# Patient Record
Sex: Male | Born: 1990 | Race: White | Hispanic: No | Marital: Single | Smoking: Never smoker
Health system: Southern US, Community
[De-identification: ages and names within clinical notes are randomized; demographics above are authoritative.]

## PROBLEM LIST (undated history)

## (undated) DIAGNOSIS — F419 Anxiety disorder, unspecified: Secondary | ICD-10-CM

## (undated) DIAGNOSIS — K219 Gastro-esophageal reflux disease without esophagitis: Secondary | ICD-10-CM

## (undated) HISTORY — PX: TONSILLECTOMY: SUR1361

## (undated) HISTORY — PX: WISDOM TOOTH EXTRACTION: SHX21

## (undated) HISTORY — DX: Gastro-esophageal reflux disease without esophagitis: K21.9

## (undated) HISTORY — DX: Anxiety disorder, unspecified: F41.9

---
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
## 2011-09-02 IMAGING — CR DG KNEE COMPLETE 4+V*R*
2 series · 2 of 2 positions shown · non-contrast
Comparison: None.

CLINICAL DATA: Bilateral knee pain intermittently for several
years.

RIGHT KNEE - COMPLETE 4+ VIEW

[view not recorded (1 of 2)]
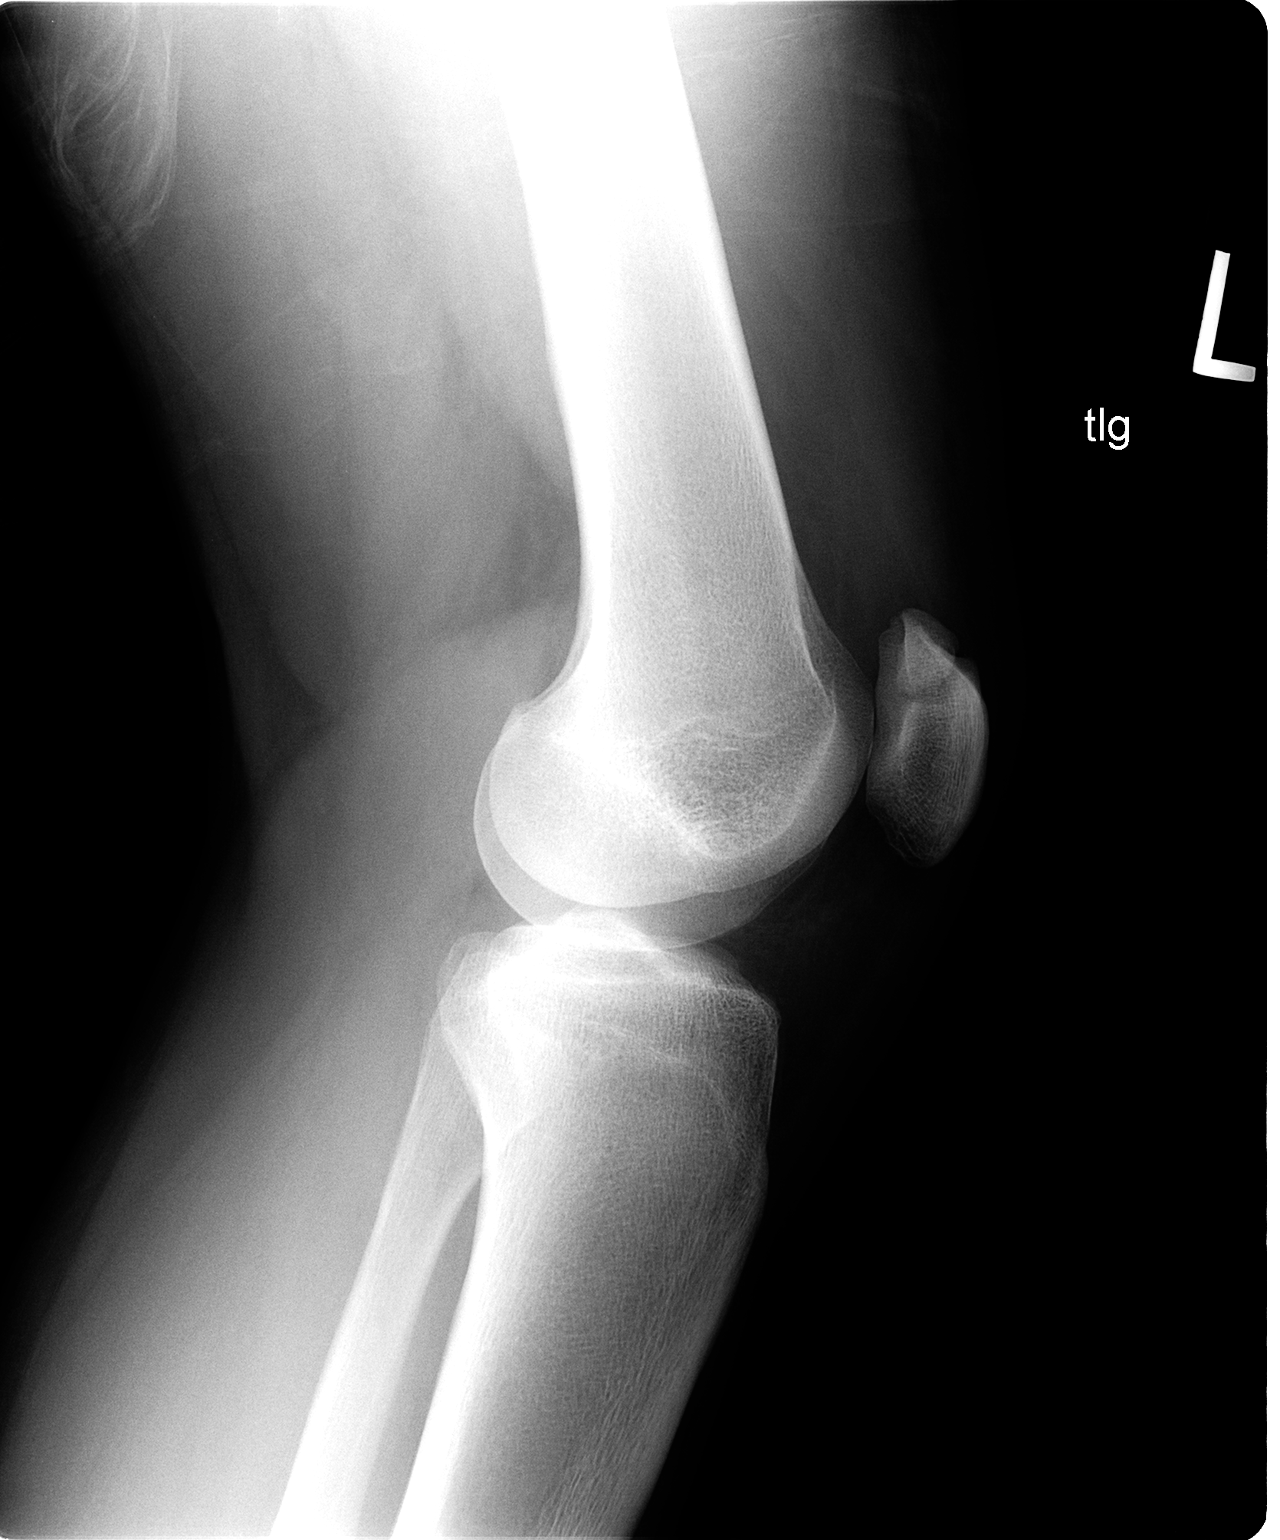

[view not recorded (2 of 2)]
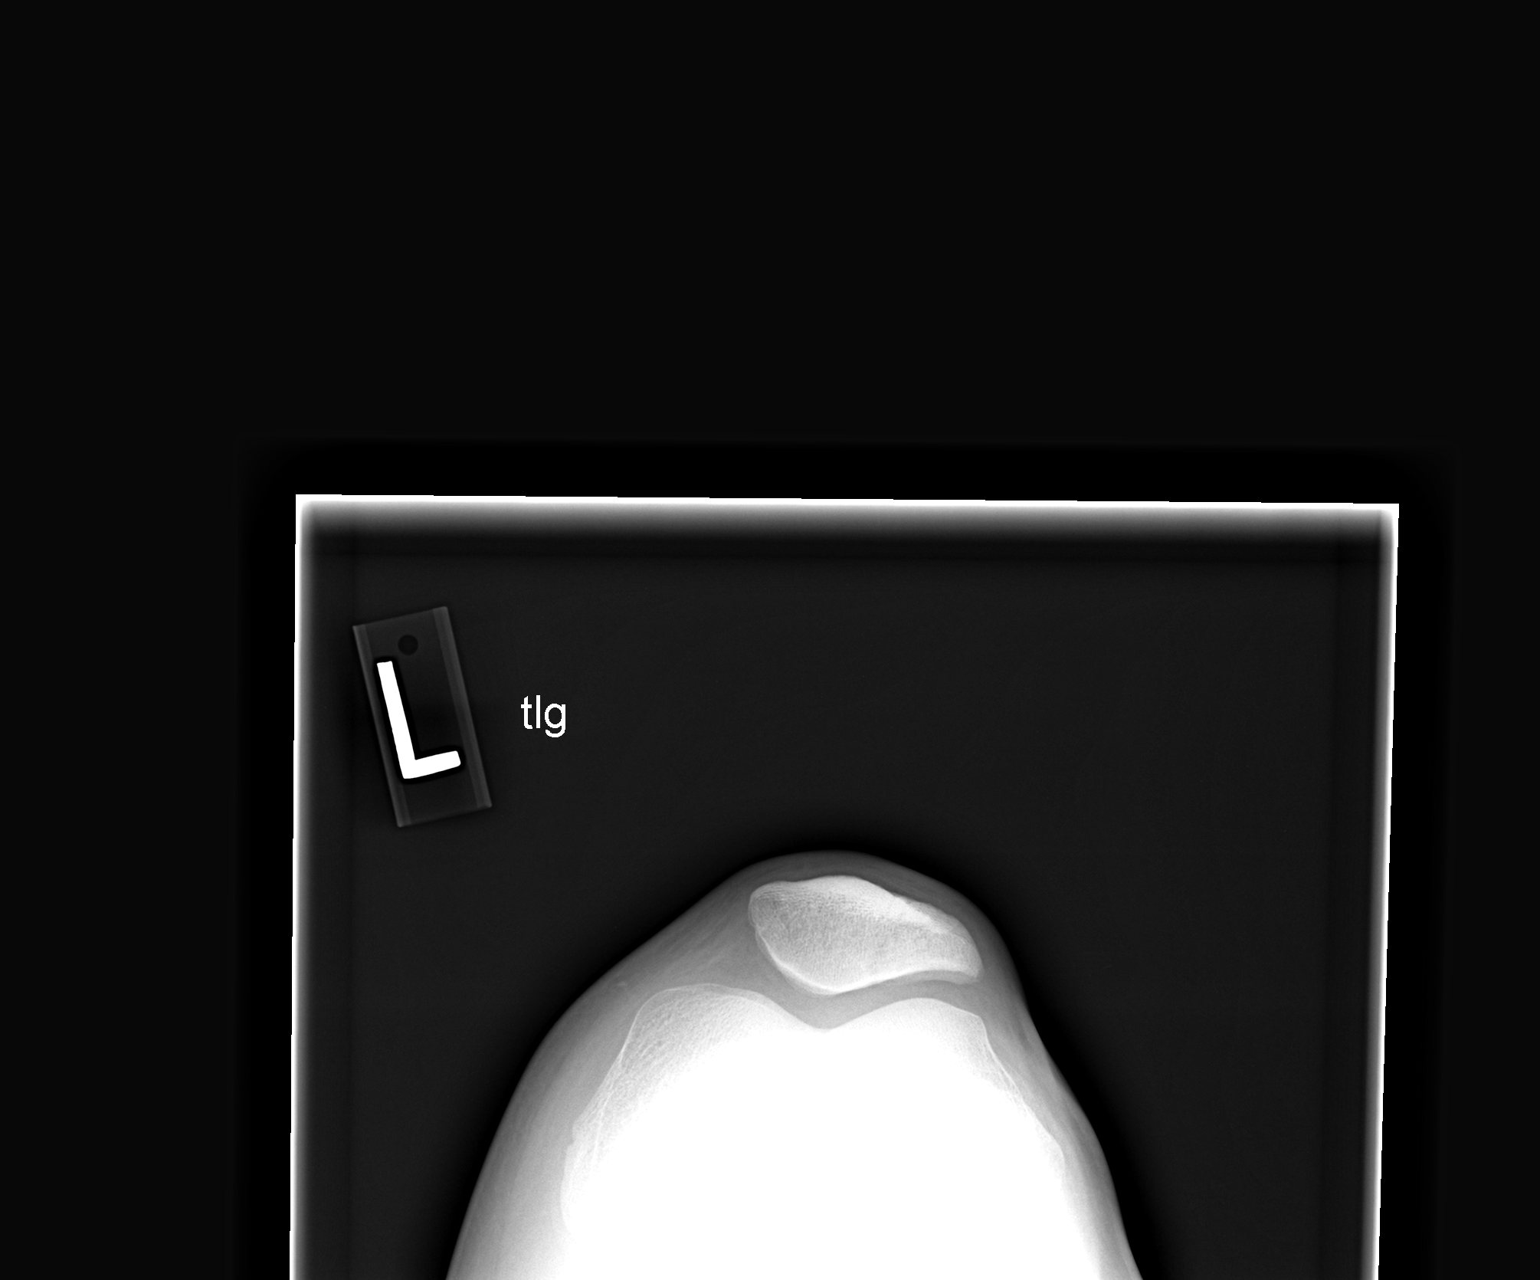

[2 of 2 positions shown; findings below may reference images not displayed]

FINDINGS: Mineralization and alignment are normal.  There is no
evidence of acute fracture or dislocation.  The patient has a
bipartite patella bilaterally.  There is a possible small right-
sided knee joint effusion.  The joint spaces appear preserved.
IMPRESSION: No acute osseous findings.  Bipartite patella.

## 2014-03-09 ENCOUNTER — Telehealth: Payer: Self-pay | Admitting: *Deleted

## 2014-03-09 ENCOUNTER — Ambulatory Visit: Payer: Self-pay | Admitting: Internal Medicine

## 2014-03-09 DIAGNOSIS — Z0289 Encounter for other administrative examinations: Secondary | ICD-10-CM

## 2014-03-09 NOTE — Telephone Encounter (Signed)
Please reschedule

## 2014-03-09 NOTE — Telephone Encounter (Signed)
Pt did not show for appointment 03/09/2014 at 3pm to Establish Care

## 2014-03-10 NOTE — Telephone Encounter (Signed)
LMOM for Pt to return call to reschedule appt.

## 2015-01-17 ENCOUNTER — Encounter: Payer: Self-pay | Admitting: Medical

## 2015-01-17 ENCOUNTER — Ambulatory Visit (INDEPENDENT_AMBULATORY_CARE_PROVIDER_SITE_OTHER): Payer: 59 | Admitting: Medical

## 2015-01-17 VITALS — BP 135/89 | HR 66 | Temp 97.9°F | Ht 69.0 in | Wt 245.2 lb

## 2015-01-17 DIAGNOSIS — L989 Disorder of the skin and subcutaneous tissue, unspecified: Secondary | ICD-10-CM

## 2015-01-17 DIAGNOSIS — R110 Nausea: Secondary | ICD-10-CM | POA: Diagnosis not present

## 2015-01-17 DIAGNOSIS — R5383 Other fatigue: Secondary | ICD-10-CM

## 2015-01-17 DIAGNOSIS — K219 Gastro-esophageal reflux disease without esophagitis: Secondary | ICD-10-CM

## 2015-01-17 DIAGNOSIS — F411 Generalized anxiety disorder: Secondary | ICD-10-CM | POA: Diagnosis not present

## 2015-01-17 HISTORY — DX: Gastro-esophageal reflux disease without esophagitis: K21.9

## 2015-01-17 LAB — CBC WITH DIFFERENTIAL/PLATELET
BASOS PCT: 0.4 % (ref 0.0–3.0)
Basophils Absolute: 0 10*3/uL (ref 0.0–0.1)
EOS ABS: 0.3 10*3/uL (ref 0.0–0.7)
Eosinophils Relative: 3.9 % (ref 0.0–5.0)
HEMATOCRIT: 49.3 % (ref 39.0–52.0)
HEMOGLOBIN: 16.7 g/dL (ref 13.0–17.0)
LYMPHS PCT: 39 % (ref 12.0–46.0)
Lymphs Abs: 3 10*3/uL (ref 0.7–4.0)
MCHC: 33.9 g/dL (ref 30.0–36.0)
MCV: 91.1 fl (ref 78.0–100.0)
Monocytes Absolute: 0.6 10*3/uL (ref 0.1–1.0)
Monocytes Relative: 7.7 % (ref 3.0–12.0)
NEUTROS PCT: 49 % (ref 43.0–77.0)
Neutro Abs: 3.7 10*3/uL (ref 1.4–7.7)
Platelets: 196 10*3/uL (ref 150.0–400.0)
RBC: 5.41 Mil/uL (ref 4.22–5.81)
RDW: 12.5 % (ref 11.5–15.5)
WBC: 7.6 10*3/uL (ref 4.0–10.5)

## 2015-01-17 LAB — COMPREHENSIVE METABOLIC PANEL
ALK PHOS: 63 U/L (ref 39–117)
ALT: 32 U/L (ref 0–53)
AST: 24 U/L (ref 0–37)
Albumin: 4.6 g/dL (ref 3.5–5.2)
BILIRUBIN TOTAL: 0.5 mg/dL (ref 0.2–1.2)
BUN: 16 mg/dL (ref 6–23)
CO2: 27 meq/L (ref 19–32)
CREATININE: 1.05 mg/dL (ref 0.40–1.50)
Calcium: 9.7 mg/dL (ref 8.4–10.5)
Chloride: 105 mEq/L (ref 96–112)
GFR: 92.66 mL/min (ref >60.00)
Glucose, Bld: 87 mg/dL (ref 70–99)
Potassium: 3.7 mEq/L (ref 3.5–5.1)
Sodium: 139 mEq/L (ref 135–145)
TOTAL PROTEIN: 7.4 g/dL (ref 6.0–8.3)

## 2015-01-17 LAB — LIPASE: LIPASE: 25 U/L (ref 11.0–59.0)

## 2015-01-17 LAB — AMYLASE: Amylase: 42 U/L (ref 27–131)

## 2015-01-17 MED ORDER — RANITIDINE HCL 150 MG PO CAPS: 150.0000 mg | ORAL_CAPSULE | Freq: Two times a day (BID) | ORAL | Status: AC

## 2015-01-17 NOTE — Assessment & Plan Note (Signed)
Will get labs today. But I think lifestyle major contributor.

## 2015-01-17 NOTE — Assessment & Plan Note (Signed)
Likely reflux related.

## 2015-01-17 NOTE — Progress Notes (Signed)
Pre visit review using our clinic review tool, if applicable. No additional management support is needed unless otherwise documented below in the visit note. 

## 2015-01-17 NOTE — Assessment & Plan Note (Signed)
By description suspicious  particularly when in [place]. Rx ranitidine. Stop alchohol, marijuana, caffeine and fried foods.  Get labs today.

## 2015-01-17 NOTE — Patient Instructions (Addendum)
Nausea without vomiting Likely reflux related.  GERD (gastroesophageal reflux disease) By description suspicious  particularly when in [place]. Rx ranitidine. Stop alchohol, marijuana, caffeine and fried foods.  Get labs today.  Fatigue Will get labs today. But I think lifestyle major contributor.  Generalized anxiety disorder I think if this flares again then rx effexor. This would be better option than possibly self treating with alcohol or marijuana.    Follow up in 2 wks or as needed

## 2015-01-17 NOTE — Addendum Note (Signed)
Addended by: Gwenevere Abbot on: 01/17/2015 10:22 AM   Modules accepted: Orders

## 2015-01-17 NOTE — Assessment & Plan Note (Signed)
I think if this flares again then rx effexor. This would be better option than possibly self treating with alcohol or marijuana.

## 2015-01-17 NOTE — Progress Notes (Addendum)
Subjective:    Patient ID: Sean Larsen, male    DOB: [birth year], 24 y.o.   MRN: [id number]  HPI  I have reviewed pt PMH, PSH, FH, Social History and Surgical History.  Pt mentions mrsa one time. Pt had nose swab + Prior to to tonsillectomy. So he got oral antibiotic.  Anxiety- history in past. Pt states controlled now. In past was on effexor.   Pt in stating for past 10 days feels some nausea after he eats. Pt states he would eat food and get nausea after each meal. He improved his diet and he still has mild nausea sensation. Some random occasional stomach pain. Some burning up to  throat while he was in [place] around 4th.   Pt feels also fatigues for a couple of weeks. Also states July 4th when he drank alcohol felt weak. At that time was drinking 3-4 drinks a day.   Pt last time he smoked marijuana was about 4 days ago. Pt smoked and ate some edible marijuana about half of the days over past month. Describes heavy use. Faint occasional ha on and off. None presently.  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    Review of Systems  Constitutional: Positive for fatigue. Negative for fever and chills.  Respiratory: Negative for cough, chest tightness, shortness of breath and wheezing.   Cardiovascular: Negative for chest pain and palpitations.  Gastrointestinal: Positive for nausea and abdominal pain. Negative for vomiting, diarrhea, constipation, blood in stool, abdominal distention and rectal pain.  Genitourinary: Negative for dysuria, frequency, hematuria, penile swelling and testicular pain.  Musculoskeletal: Negative for back pain.  Neurological: Negative for dizziness, speech difficulty, weakness, light-headedness and headaches.  Hematological: Negative for adenopathy. Does not bruise/bleed easily.  Psychiatric/Behavioral: Negative for behavioral problems and confusion.     Past Medical  History  Diagnosis Date  . Anxiety     History   Social History  . Marital Status: Single    Spouse Name: N/A  . Number of Children: N/A  . Years of Education: N/A   Occupational History  . Not on file.   Social History Main Topics  . Smoking status: Never Smoker   . Smokeless tobacco: Never Used  . Alcohol Use: 0.0 oz/week    0 Standard drinks or equivalent per week     Comment: Recently stopped but was drinking 2 beers  twice a week.  . Drug Use: Yes    Special: Marijuana  . Sexual Activity: No   Other Topics Concern  . Not on file   Social History Narrative  . No narrative on file    Past Surgical History  Procedure Laterality Date  . Tonsillectomy    . Wisdom tooth extraction      Family History  Problem Relation Age of Onset  . Thyroid disease Mother     No Known Allergies  No current outpatient prescriptions on file prior to visit.   No current facility-administered medications on file prior to visit.    BP 135/89 mmHg  Pulse 66  Temp(Src) 97.9 F (36.6 C) (Oral)  Ht  (1.753 m)  Wt 245 lb 3.2 oz (111.222 kg)  BMI 36.19 kg/m2  SpO2 98%       Objective:   Physical Exam  General Mental Status- Alert. General Appearance- Not in acute distress.   Skin General: Color- Normal Color. Moisture- Normal Moisture.  Neck Carotid  Arteries- Normal color. Moisture- Normal Moisture. No carotid bruits. No JVD.  Chest and Lung Exam Auscultation: Breath Sounds:-Normal.  Cardiovascular Auscultation:Rythm- Regular, Rate and Rythm Murmurs & Other Heart Sounds:Auscultation of the heart reveals- No Murmurs.  Abdomen Inspection:-Inspeection Normal. Palpation/Percussion:Note:No mass. Palpation and Percussion of the abdomen reveal- Non Tender, Non Distended + BS, no rebound or guarding.    Neurologic Cranial Nerve exam:- CN III-XII intact(No nystagmus), symmetric smile. Finger to Nose:- Normal/Intact Strength:- 5/5 equal and symmetric  strength both upper and lower extremities.  Rt forearm- 8mm raised mole.      Assessment & Plan:
